# Patient Record
Sex: Female | Born: 1960 | Race: White | Hispanic: No | Marital: Married | Smoking: Never smoker
Health system: Southern US, Community
[De-identification: ages and names within clinical notes are randomized; demographics above are authoritative.]

## PROBLEM LIST (undated history)

## (undated) DIAGNOSIS — D249 Benign neoplasm of unspecified breast: Secondary | ICD-10-CM

## (undated) DIAGNOSIS — S83519A Sprain of anterior cruciate ligament of unspecified knee, initial encounter: Secondary | ICD-10-CM

## (undated) DIAGNOSIS — D259 Leiomyoma of uterus, unspecified: Secondary | ICD-10-CM

## (undated) DIAGNOSIS — N951 Menopausal and female climacteric states: Secondary | ICD-10-CM

## (undated) DIAGNOSIS — M419 Scoliosis, unspecified: Secondary | ICD-10-CM

## (undated) DIAGNOSIS — N898 Other specified noninflammatory disorders of vagina: Secondary | ICD-10-CM

## (undated) DIAGNOSIS — N6009 Solitary cyst of unspecified breast: Secondary | ICD-10-CM

## (undated) DIAGNOSIS — N941 Unspecified dyspareunia: Secondary | ICD-10-CM

## (undated) HISTORY — PX: ACHILLES TENDON SURGERY: SHX542

## (undated) HISTORY — PX: ENDOVENOUS ABLATION SAPHENOUS VEIN W/ LASER: SUR449

## (undated) HISTORY — DX: Scoliosis, unspecified: M41.9

## (undated) HISTORY — PX: OTHER SURGICAL HISTORY: SHX169

## (undated) HISTORY — DX: Solitary cyst of unspecified breast: N60.09

## (undated) HISTORY — DX: Other specified noninflammatory disorders of vagina: N89.8

## (undated) HISTORY — DX: Menopausal and female climacteric states: N95.1

## (undated) HISTORY — DX: Unspecified dyspareunia: N94.10

## (undated) HISTORY — DX: Leiomyoma of uterus, unspecified: D25.9

## (undated) HISTORY — DX: Benign neoplasm of unspecified breast: D24.9

## (undated) HISTORY — DX: Sprain of anterior cruciate ligament of unspecified knee, initial encounter: S83.519A

---

## 2009-03-02 ENCOUNTER — Ambulatory Visit: Payer: Self-pay | Admitting: Orthopedic Surgery

## 2009-08-13 ENCOUNTER — Encounter: Admission: RE | Admit: 2009-08-13 | Discharge: 2009-08-13 | Payer: Self-pay | Admitting: Obstetrics and Gynecology

## 2010-08-01 ENCOUNTER — Other Ambulatory Visit: Payer: Self-pay | Admitting: Obstetrics and Gynecology

## 2010-08-01 DIAGNOSIS — Z1231 Encounter for screening mammogram for malignant neoplasm of breast: Secondary | ICD-10-CM

## 2010-08-15 ENCOUNTER — Ambulatory Visit
Admission: RE | Admit: 2010-08-15 | Discharge: 2010-08-15 | Disposition: A | Discharge status: Home / Self Care | Payer: Private Health Insurance - Indemnity | Source: Ambulatory Visit | Attending: Obstetrics and Gynecology | Admitting: Obstetrics and Gynecology

## 2010-08-15 DIAGNOSIS — Z1231 Encounter for screening mammogram for malignant neoplasm of breast: Secondary | ICD-10-CM

## 2011-08-06 ENCOUNTER — Other Ambulatory Visit: Payer: Self-pay | Admitting: Obstetrics and Gynecology

## 2011-08-06 DIAGNOSIS — Z1231 Encounter for screening mammogram for malignant neoplasm of breast: Secondary | ICD-10-CM

## 2011-08-19 ENCOUNTER — Ambulatory Visit
Admission: RE | Admit: 2011-08-19 | Discharge: 2011-08-19 | Disposition: A | Discharge status: Home / Self Care | Payer: Private Health Insurance - Indemnity | Source: Ambulatory Visit | Attending: Obstetrics and Gynecology | Admitting: Obstetrics and Gynecology

## 2011-08-19 DIAGNOSIS — Z1231 Encounter for screening mammogram for malignant neoplasm of breast: Secondary | ICD-10-CM

## 2012-08-11 ENCOUNTER — Other Ambulatory Visit: Payer: Self-pay

## 2012-08-11 DIAGNOSIS — Z1231 Encounter for screening mammogram for malignant neoplasm of breast: Secondary | ICD-10-CM

## 2012-09-15 ENCOUNTER — Ambulatory Visit
Admission: RE | Admit: 2012-09-15 | Discharge: 2012-09-15 | Disposition: A | Discharge status: Home / Self Care | Payer: 59 | Source: Ambulatory Visit

## 2012-09-15 DIAGNOSIS — Z1231 Encounter for screening mammogram for malignant neoplasm of breast: Secondary | ICD-10-CM

## 2013-08-20 LAB — HM PAP SMEAR: HM Pap smear: NEGATIVE

## 2013-08-25 ENCOUNTER — Other Ambulatory Visit: Payer: Self-pay | Admitting: Obstetrics and Gynecology

## 2013-08-25 DIAGNOSIS — Z1231 Encounter for screening mammogram for malignant neoplasm of breast: Secondary | ICD-10-CM

## 2013-09-16 ENCOUNTER — Ambulatory Visit
Admission: RE | Admit: 2013-09-16 | Discharge: 2013-09-16 | Disposition: A | Discharge status: Home / Self Care | Payer: 59 | Source: Ambulatory Visit | Attending: Obstetrics and Gynecology | Admitting: Obstetrics and Gynecology

## 2013-09-16 DIAGNOSIS — Z1231 Encounter for screening mammogram for malignant neoplasm of breast: Secondary | ICD-10-CM

## 2013-09-16 LAB — HM MAMMOGRAPHY

## 2014-08-30 ENCOUNTER — Other Ambulatory Visit: Payer: Self-pay | Admitting: Obstetrics and Gynecology

## 2014-08-30 ENCOUNTER — Other Ambulatory Visit: Payer: Self-pay

## 2014-08-30 DIAGNOSIS — Z1231 Encounter for screening mammogram for malignant neoplasm of breast: Secondary | ICD-10-CM

## 2014-08-30 DIAGNOSIS — Z01419 Encounter for gynecological examination (general) (routine) without abnormal findings: Secondary | ICD-10-CM

## 2014-09-18 ENCOUNTER — Ambulatory Visit: Payer: Self-pay

## 2014-09-20 ENCOUNTER — Ambulatory Visit
Admission: RE | Admit: 2014-09-20 | Discharge: 2014-09-20 | Disposition: A | Discharge status: Home / Self Care | Payer: 59 | Source: Ambulatory Visit

## 2014-09-20 DIAGNOSIS — Z1231 Encounter for screening mammogram for malignant neoplasm of breast: Secondary | ICD-10-CM

## 2015-09-06 ENCOUNTER — Encounter: Payer: Self-pay | Admitting: Obstetrics and Gynecology

## 2015-09-25 ENCOUNTER — Ambulatory Visit (INDEPENDENT_AMBULATORY_CARE_PROVIDER_SITE_OTHER): Payer: 59 | Admitting: Obstetrics and Gynecology

## 2015-09-25 ENCOUNTER — Encounter: Payer: Self-pay | Admitting: Obstetrics and Gynecology

## 2015-09-25 VITALS — BP 106/71 | HR 65 | Ht 66.0 in | Wt 146.0 lb

## 2015-09-25 DIAGNOSIS — Z01419 Encounter for gynecological examination (general) (routine) without abnormal findings: Secondary | ICD-10-CM

## 2015-09-25 DIAGNOSIS — N952 Postmenopausal atrophic vaginitis: Secondary | ICD-10-CM

## 2015-09-25 DIAGNOSIS — Z780 Asymptomatic menopausal state: Secondary | ICD-10-CM | POA: Insufficient documentation

## 2015-09-25 NOTE — Progress Notes (Signed)
Patient ID: Madison Campbell, female   DOB: 12-03-1960, 55 y.o.   MRN: [id number] ANNUAL PREVENTATIVE CARE GYN  ENCOUNTER NOTE  Subjective:       Madison Campbell is a 55 y.o. G23P1001 female here for a routine annual gynecologic exam.  Current complaints: 1.  No complaints    Gynecologic History No LMP recorded. Patient is postmenopausal. Contraception: post menopausal status Last Pap: 09/2013. Results were: normal Last mammogram: 09/2014. Results were: normal Currently not sexually active. 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 Dyspareunia, female   . Menopausal state   . Breast fibroadenoma   . Breast cyst   . Scoliosis   . Vaginal dryness   . Torn ACL   . Uterine fibroid     Past Surgical History  Procedure Laterality Date  . Excision fibroadenoma    . Achilles tendon surgery    . Acl repair    . Endovenous ablation saphenous vein w/ laser      Current Outpatient Prescriptions on File Prior to Visit  Medication Sig Dispense Refill  . calcium carbonate (CALCIUM 600) 600 MG TABS tablet Take by mouth.     No current facility-administered medications on file prior to visit.    Allergies  Allergen Reactions  . Penicillin G Other (See Comments)    Sweating and decreased pulse     Social History   Social History  . Marital Status: Married    Spouse Name: N/A  . Number of Children: N/A  . Years of Education: N/A   Occupational History  . Not on file.   Social History Main Topics  . Smoking status: Never Smoker   . Smokeless tobacco: Not on file  . Alcohol Use: No  . Drug Use: No  . Sexual Activity: No   Other Topics Concern  . Not on file   Social History  Narrative    Family History  Problem Relation Age of Onset  . Breast cancer Paternal Aunt   . Colon cancer Neg Hx   . Ovarian cancer Neg Hx   . Heart disease Neg Hx     The following portions of the patient's history were reviewed and updated as appropriate: allergies, current medications, past family history, past medical history, past social history, past surgical history and problem list.  Review of Systems ROS Review of Systems - General ROS: negative for - chills, fatigue, fever, hot flashes, night sweats, weight gain or weight loss Psychological ROS: negative for - anxiety, decreased libido, depression, mood swings, physical abuse or sexual abuse Ophthalmic ROS: negative for - blurry vision, eye pain or loss of vision ENT ROS: negative for - headaches, hearing change, visual changes or vocal changes Allergy and Immunology ROS: negative for - hives, itchy/watery eyes or seasonal allergies Hematological and Lymphatic ROS: negative for - bleeding problems, bruising, swollen lymph nodes or weight loss Endocrine ROS: negative for - galactorrhea, hair pattern changes, hot flashes, malaise/lethargy, mood swings, palpitations, polydipsia/polyuria, skin changes, temperature intolerance or unexpected weight changes Breast ROS: negative for - new or  changing breast lumps or nipple discharge Respiratory ROS: negative for - cough or shortness of breath Cardiovascular ROS: negative for - chest pain, irregular heartbeat, palpitations or shortness of breath Gastrointestinal ROS: no abdominal pain, change in bowel habits, or black or bloody stools Genito-Urinary ROS: no dysuria, trouble voiding, or hematuria Musculoskeletal ROS: negative for - joint pain or joint stiffness Neurological ROS: negative for - bowel and bladder control changes Dermatological ROS: negative for rash and skin lesion changes   Objective:   BP 106/71 mmHg  Pulse 65  Ht 5\' 6"  (1.676 m)  Wt 146 lb (66.225 kg)  BMI  23.58 kg/m2 CONSTITUTIONAL: Well-developed, well-nourished female in no acute distress.  PSYCHIATRIC: Normal mood and affect. Normal behavior. Normal judgment and thought content. [place]: Alert and oriented to person, place, and time. Normal muscle tone coordination. No cranial nerve deficit noted. HENT:  Normocephalic, atraumatic, External right and left ear normal. Oropharynx is clear and moist EYES: Conjunctivae and EOM are normal.  No scleral icterus.  NECK: Normal range of motion, supple, no masses.  Normal thyroid.  SKIN: Skin is warm and dry. No rash noted. Not diaphoretic. No erythema. No pallor. CARDIOVASCULAR: Normal heart rate noted, regular rhythm, no murmur. RESPIRATORY: Clear to auscultation bilaterally. Effort and breath sounds normal, no problems with respiration noted. BREASTS: Symmetric in size. No masses, skin changes, nipple drainage, or lymphadenopathy. ABDOMEN: Soft, normal bowel sounds, no distention noted.  No tenderness, rebound or guarding.  BLADDER: Normal PELVIC:  External Genitalia: Moderate atrophy  BUS: Normal  Vagina: Moderate to severe atrophy with decreased ruga, pale mucosa and minimal petechiae  Cervix: Normal; No lesions  Uterus: Normal; Midplane, normal size and shape, mobile  Adnexa: Normal  RV: External Exam NormaI, No Rectal Masses and Normal Sphincter tone  MUSCULOSKELETAL: Normal range of motion. No tenderness.  No cyanosis, clubbing, or edema.  2+ distal pulses. LYMPHATIC: No Axillary, Supraclavicular, or Inguinal Adenopathy.    Assessment:   Annual gynecologic examination 55 y.o. Contraception: post menopausal status Normal BMI Vaginal atrophy, moderate to severe, asymptomatic, not desiring treatment  Plan:  Pap: due 2018 Mammogram: done 09/2014 birad 1 Stool Guaiac Testing:  Ordered Labs: vit d tsh fbs a1c lipid Routine preventative health maintenance measures emphasized: Exercise/Diet/Weight control, Tobacco Warnings and  Alcohol/Substance use risks Return to [place], CMA  Brayton Mars, MD  Note: This dictation was prepared with Dragon dictation along with smaller phrase technology. Any transcriptional errors that result from this process are unintentional.

## 2015-09-25 NOTE — Patient Instructions (Signed)
1.  No Pap smear until 2018 2.  Mammogram ordered. 3.  Continue with healthy eating and exercise. 4.  Continue with calcium and vitamin D supplementation. 5.  Return in 1 year

## 2015-09-26 LAB — LIPID PANEL
CHOL/HDL RATIO: 2.6 ratio (ref 0.0–4.4)
Cholesterol, Total: 204 mg/dL — ABNORMAL HIGH (ref 100–199)
HDL: 77 mg/dL (ref >39)
LDL CALC: 118 mg/dL — AB (ref 0–99)
Triglycerides: 45 mg/dL (ref 0–149)
VLDL Cholesterol Cal: 9 mg/dL (ref 5–40)

## 2015-09-26 LAB — HEMOGLOBIN A1C
Est. average glucose Bld gHb Est-mCnc: 103 mg/dL
Hgb A1c MFr Bld: 5.2 % (ref 4.8–5.6)

## 2015-09-26 LAB — GLUCOSE, RANDOM: GLUCOSE: 89 mg/dL (ref 65–99)

## 2015-09-26 LAB — TSH: TSH: 1.19 u[IU]/mL (ref 0.450–4.500)

## 2015-09-26 LAB — VITAMIN D 25 HYDROXY (VIT D DEFICIENCY, FRACTURES): Vit D, 25-Hydroxy: 30.6 ng/mL (ref 30.0–100.0)

## 2015-09-30 LAB — FECAL OCCULT BLOOD, IMMUNOCHEMICAL: Fecal Occult Bld: NEGATIVE

## 2016-09-29 NOTE — Progress Notes (Signed)
Patient ID: Madison Campbell, female   DOB: [birth year], 58 y.o.   MRN: [id number] ANNUAL PREVENTATIVE CARE GYN  ENCOUNTER NOTE  Subjective:       Madison Campbell is a 56 y.o. G70P1001 female here for a routine annual gynecologic exam.  Current complaints: 1.  No complaints   Patient reports no new medical issues since her last exam.  She is post menopausal, and denies any vasomotor symptoms, vaginal dryness, urinary changes, changes in bowel habits.  She states she has had a 5# weight gain since last year and attributes it to more family visiting and eating more with family and friends.  She remains active and plays tennis regularly and maintains a healthy lifestyle avoiding alcohol, tobacco and drugs.  She takes Calcium supplementation 600mg / daily.    Gynecologic History No LMP recorded. Patient is postmenopausal. Contraception: post menopausal status Last Pap: 09/2013.  Neg/neg Results were: normal Last mammogram: 09/2014. birad 1 Results were: normal Currently not sexually active. 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 Breast cyst   . Breast fibroadenoma   . Dyspareunia, female   . Menopausal state   . Scoliosis   . Torn ACL   . Uterine fibroid   . Vaginal dryness     Past Surgical History:  Procedure Laterality Date  . ACHILLES TENDON SURGERY    . ACL REPAIR    . ENDOVENOUS ABLATION SAPHENOUS VEIN W/ LASER    . EXCISION FIBROADENOMA      Current Outpatient Prescriptions on File Prior to Visit  Medication Sig Dispense Refill  . calcium carbonate (CALCIUM 600) 600 MG TABS tablet Take by mouth.     No current facility-administered medications on  file prior to visit.     Allergies  Allergen Reactions  . Penicillin G Other (See Comments)    Sweating and decreased pulse     Social History   Social History  . Marital status: Married    Spouse name: N/A  . Number of children: N/A  . Years of education: N/A   Occupational History  . Not on file.   Social History Main Topics  . Smoking status: Never Smoker  . Smokeless tobacco: Never Used  . Alcohol use No  . Drug use: No  . Sexual activity: No   Other Topics Concern  . Not on file   Social History Narrative  . No narrative on file    Family History  Problem Relation Age of Onset  . Breast cancer Paternal Aunt   . Colon cancer Neg Hx   . Ovarian cancer Neg Hx   . Heart disease Neg Hx     The following portions of the patient's history were reviewed and updated as appropriate: allergies, current medications, past family history, past medical history, past social history, past surgical history and problem list.  Review of Systems ROS  See HPI for pertinent HPI Objective:   BP 124/77   Pulse (!) 50   Ht 5\' 6"  (  1.676 m)   Wt 150 lb 12.8 oz (68.4 kg)   BMI 24.34 kg/m  CONSTITUTIONAL: Well-developed, well-nourished female in no acute distress.  PSYCHIATRIC: Normal mood and affect. Normal behavior. Normal judgment and thought content. [place]: Alert and oriented to person, place, and time. Normal muscle tone coordination. No cranial nerve deficit noted. HENT:  Normocephalic, atraumatic, External right and left ear normal. Oropharynx is clear and moist EYES: Conjunctivae and EOM are normal.  No scleral icterus.  NECK: Normal range of motion, supple, no masses.  Normal thyroid.  SKIN: Skin is warm and dry. No rash noted. Not diaphoretic. No erythema. No pallor. CARDIOVASCULAR: Normal heart rate noted, regular rhythm, no murmur. RESPIRATORY: Clear to auscultation bilaterally. Effort and breath sounds normal, no problems with respiration noted. BREASTS:  Symmetric in size. No masses, skin changes, nipple drainage, or lymphadenopathy. ABDOMEN: Soft, normal bowel sounds, no distention noted.  No tenderness, rebound or guarding.  BLADDER: Normal PELVIC:  External Genitalia: Moderate atrophy  BUS: Normal  Vagina: Moderate to severe atrophy with decreased ruga, pale mucosa and minimal petechiae  Cervix: Normal; No lesions  Uterus: Normal; Midplane, normal size and shape, mobile  Adnexa: Normal, non tender, nonpalpable  RV: External Exam NormaI, No Rectal Masses and Normal Sphincter tone  MUSCULOSKELETAL: Normal range of motion. No tenderness.  No cyanosis, clubbing, or edema.  2+ distal pulses. LYMPHATIC: No Axillary, Supraclavicular, or Inguinal Adenopathy.    Assessment:   Annual gynecologic examination 56 y.o. Contraception: post menopausal status Normal BMI Vaginal atrophy, moderate to severe, asymptomatic, not desiring treatment  Plan:  Pap: pap/hpv Mammogram: ordered Stool Guaiac Testing:  Ordered Labs: fbs a1c lipid tsh Routine preventative health maintenance measures emphasized: Exercise/Diet/Weight control, Tobacco Warnings and Alcohol/Substance use risks Return to Clinic - [place], Student-PA  Brayton Mars, MD   I have seen, interviewed, and examined the patient in conjunction with the [place].A. student and affirm the diagnosis and management plan. Riddick Nuon A. Chaney Ingram, MD, FACOG   Note: This dictation was prepared with Dragon dictation along with smaller phrase technology. Any transcriptional errors that result from this process are unintentional.

## 2016-09-30 ENCOUNTER — Ambulatory Visit (INDEPENDENT_AMBULATORY_CARE_PROVIDER_SITE_OTHER): Payer: 59 | Admitting: Obstetrics and Gynecology

## 2016-09-30 ENCOUNTER — Encounter: Payer: Self-pay | Admitting: Obstetrics and Gynecology

## 2016-09-30 VITALS — BP 124/77 | HR 50 | Ht 66.0 in | Wt 150.8 lb

## 2016-09-30 DIAGNOSIS — Z780 Asymptomatic menopausal state: Secondary | ICD-10-CM | POA: Diagnosis not present

## 2016-09-30 DIAGNOSIS — Z01419 Encounter for gynecological examination (general) (routine) without abnormal findings: Secondary | ICD-10-CM

## 2016-09-30 DIAGNOSIS — Z1231 Encounter for screening mammogram for malignant neoplasm of breast: Secondary | ICD-10-CM

## 2016-09-30 DIAGNOSIS — Z1239 Encounter for other screening for malignant neoplasm of breast: Secondary | ICD-10-CM

## 2016-09-30 DIAGNOSIS — Z1211 Encounter for screening for malignant neoplasm of colon: Secondary | ICD-10-CM

## 2016-09-30 DIAGNOSIS — N952 Postmenopausal atrophic vaginitis: Secondary | ICD-10-CM | POA: Diagnosis not present

## 2016-09-30 NOTE — Patient Instructions (Signed)
1. Pap smear is done 2. Mammogram is ordered 3. This stool guaiac card testing is ordered 4. Screening labs are ordered 5. Continue with healthy eating, exercise. 6. Continue with calcium and vitamin D supplementation 7. Return in 1 year   Health Maintenance for Postmenopausal Women Menopause is a normal process in which your reproductive ability comes to an end. This process happens gradually over a span of months to years, usually between the ages of 74 and 72. Menopause is complete when you have missed 12 consecutive menstrual periods. It is important to talk with your health care provider about some of the most common conditions that affect postmenopausal women, such as heart disease, cancer, and bone loss (osteoporosis). Adopting a healthy lifestyle and getting preventive care can help to promote your health and wellness. Those actions can also lower your chances of developing some of these common conditions. What should I know about menopause? During menopause, you may experience a number of symptoms, such as:  Moderate-to-severe hot flashes.  Night sweats.  Decrease in sex drive.  Mood swings.  Headaches.  Tiredness.  Irritability.  Memory problems.  Insomnia.  Choosing to treat or not to treat menopausal changes is an individual decision that you make with your health care provider. What should I know about hormone replacement therapy and supplements? Hormone therapy products are effective for treating symptoms that are associated with menopause, such as hot flashes and night sweats. Hormone replacement carries certain risks, especially as you become older. If you are thinking about using estrogen or estrogen with progestin treatments, discuss the benefits and risks with your health care provider. What should I know about heart disease and stroke? Heart disease, heart attack, and stroke become more likely as you age. This may be due, in part, to the hormonal changes that  your body experiences during menopause. These can affect how your body processes dietary fats, triglycerides, and cholesterol. Heart attack and stroke are both medical emergencies. There are many things that you can do to help prevent heart disease and stroke:  Have your blood pressure checked at least every 1-2 years. High blood pressure causes heart disease and increases the risk of stroke.  If you are 59-69 years old, ask your health care provider if you should take aspirin to prevent a heart attack or a stroke.  Do not use any tobacco products, including cigarettes, chewing tobacco, or electronic cigarettes. If you need help quitting, ask your health care provider.  It is important to eat a healthy diet and maintain a healthy weight. ? Be sure to include plenty of vegetables, fruits, low-fat dairy products, and lean protein. ? Avoid eating foods that are high in solid fats, added sugars, or salt (sodium).  Get regular exercise. This is one of the most important things that you can do for your health. ? Try to exercise for at least 150 minutes each week. The type of exercise that you do should increase your heart rate and make you sweat. This is known as moderate-intensity exercise. ? Try to do strengthening exercises at least twice each week. Do these in addition to the moderate-intensity exercise.  Know your numbers.Ask your health care provider to check your cholesterol and your blood glucose. Continue to have your blood tested as directed by your health care provider.  What should I know about cancer screening? There are several types of cancer. Take the following steps to reduce your risk and to catch any cancer development as early as possible.  Breast Cancer  Practice breast self-awareness. ? This means understanding how your breasts normally appear and feel. ? It also means doing regular breast self-exams. Let your health care provider know about any changes, no matter how  small.  If you are 40 or older, have a clinician do a breast exam (clinical breast exam or CBE) every year. Depending on your age, family history, and medical history, it may be recommended that you also have a yearly breast X-ray (mammogram).  If you have a family history of breast cancer, talk with your health care provider about genetic screening.  If you are at high risk for breast cancer, talk with your health care provider about having an MRI and a mammogram every year.  Breast cancer (BRCA) gene test is recommended for women who have family members with BRCA-related cancers. Results of the assessment will determine the need for genetic counseling and BRCA1 and for BRCA2 testing. BRCA-related cancers include these types: ? Breast. This occurs in males or females. ? Ovarian. ? Tubal. This may also be called fallopian tube cancer. ? Cancer of the abdominal or pelvic lining (peritoneal cancer). ? Prostate. ? Pancreatic.  Cervical, Uterine, and Ovarian Cancer Your health care provider may recommend that you be screened regularly for cancer of the pelvic organs. These include your ovaries, uterus, and vagina. This screening involves a pelvic exam, which includes checking for microscopic changes to the surface of your cervix (Pap test).  For women ages 21-65, health care providers may recommend a pelvic exam and a Pap test every three years. For women ages 12-65, they may recommend the Pap test and pelvic exam, combined with testing for human papilloma virus (HPV), every five years. Some types of HPV increase your risk of cervical cancer. Testing for HPV may also be done on women of any age who have unclear Pap test results.  Other health care providers may not recommend any screening for nonpregnant women who are considered low risk for pelvic cancer and have no symptoms. Ask your health care provider if a screening pelvic exam is right for you.  If you have had past treatment for cervical  cancer or a condition that could lead to cancer, you need Pap tests and screening for cancer for at least 20 years after your treatment. If Pap tests have been discontinued for you, your risk factors (such as having a new sexual partner) need to be reassessed to determine if you should start having screenings again. Some women have medical problems that increase the chance of getting cervical cancer. In these cases, your health care provider may recommend that you have screening and Pap tests more often.  If you have a family history of uterine cancer or ovarian cancer, talk with your health care provider about genetic screening.  If you have vaginal bleeding after reaching menopause, tell your health care provider.  There are currently no reliable tests available to screen for ovarian cancer.  Lung Cancer Lung cancer screening is recommended for adults 22-56 years old who are at high risk for lung cancer because of a history of smoking. A yearly low-dose CT scan of the lungs is recommended if you:  Currently smoke.  Have a history of at least 30 pack-years of smoking and you currently smoke or have quit within the past 15 years. A pack-year is smoking an average of one pack of cigarettes per day for one year.  Yearly screening should:  Continue until it has been 15 years since  you quit.  Stop if you develop a health problem that would prevent you from having lung cancer treatment.  Colorectal Cancer  This type of cancer can be detected and can often be prevented.  Routine colorectal cancer screening usually begins at age 79 and continues through age 37.  If you have risk factors for colon cancer, your health care provider may recommend that you be screened at an earlier age.  If you have a family history of colorectal cancer, talk with your health care provider about genetic screening.  Your health care provider may also recommend using home test kits to check for hidden blood in your  stool.  A small camera at the end of a tube can be used to examine your colon directly (sigmoidoscopy or colonoscopy). This is done to check for the earliest forms of colorectal cancer.  Direct examination of the colon should be repeated every 5-10 years until age 24. However, if early forms of precancerous polyps or small growths are found or if you have a family history or genetic risk for colorectal cancer, you may need to be screened more often.  Skin Cancer  Check your skin from head to toe regularly.  Monitor any moles. Be sure to tell your health care provider: ? About any new moles or changes in moles, especially if there is a change in a mole's shape or color. ? If you have a mole that is larger than the size of a pencil eraser.  If any of your family members has a history of skin cancer, especially at a young age, talk with your health care provider about genetic screening.  Always use sunscreen. Apply sunscreen liberally and repeatedly throughout the day.  Whenever you are outside, protect yourself by wearing long sleeves, pants, a wide-brimmed hat, and sunglasses.  What should I know about osteoporosis? Osteoporosis is a condition in which bone destruction happens more quickly than new bone creation. After menopause, you may be at an increased risk for osteoporosis. To help prevent osteoporosis or the bone fractures that can happen because of osteoporosis, the following is recommended:  If you are 89-106 years old, get at least 1,000 mg of calcium and at least 600 mg of vitamin D per day.  If you are older than age 3 but younger than age 40, get at least 1,200 mg of calcium and at least 600 mg of vitamin D per day.  If you are older than age 38, get at least 1,200 mg of calcium and at least 800 mg of vitamin D per day.  Smoking and excessive alcohol intake increase the risk of osteoporosis. Eat foods that are rich in calcium and vitamin D, and do weight-bearing exercises  several times each week as directed by your health care provider. What should I know about how menopause affects my mental health? Depression may occur at any age, but it is more common as you become older. Common symptoms of depression include:  Low or sad mood.  Changes in sleep patterns.  Changes in appetite or eating patterns.  Feeling an overall lack of motivation or enjoyment of activities that you previously enjoyed.  Frequent crying spells.  Talk with your health care provider if you think that you are experiencing depression. What should I know about immunizations? It is important that you get and maintain your immunizations. These include:  Tetanus, diphtheria, and pertussis (Tdap) booster vaccine.  Influenza every year before the flu season begins.  Pneumonia vaccine.  Shingles  vaccine.  Your health care provider may also recommend other immunizations. This information is not intended to replace advice given to you by your health care provider. Make sure you discuss any questions you have with your health care provider. Document Released: 05/16/2005 Document Revised: 10/12/2015 Document Reviewed: 12/26/2014 Elsevier Interactive Patient Education  2018 [employer].

## 2016-10-01 LAB — LIPID PANEL
CHOLESTEROL TOTAL: 193 mg/dL (ref 100–199)
Chol/HDL Ratio: 3.2 ratio (ref 0.0–4.4)
HDL: 61 mg/dL (ref >39)
LDL Calculated: 116 mg/dL — ABNORMAL HIGH (ref 0–99)
TRIGLYCERIDES: 79 mg/dL (ref 0–149)
VLDL CHOLESTEROL CAL: 16 mg/dL (ref 5–40)

## 2016-10-01 LAB — GLUCOSE, RANDOM: Glucose: 91 mg/dL (ref 65–99)

## 2016-10-01 LAB — HEMOGLOBIN A1C
ESTIMATED AVERAGE GLUCOSE: 103 mg/dL
Hgb A1c MFr Bld: 5.2 % (ref 4.8–5.6)

## 2016-10-01 LAB — TSH: TSH: 1.35 u[IU]/mL (ref 0.450–4.500)

## 2016-10-02 LAB — PAP IG AND HPV HIGH-RISK
HPV, high-risk: NEGATIVE
PAP SMEAR COMMENT: 0

## 2016-10-03 LAB — FECAL OCCULT BLOOD, IMMUNOCHEMICAL: Fecal Occult Bld: NEGATIVE

## 2016-10-17 ENCOUNTER — Other Ambulatory Visit: Payer: Self-pay | Admitting: Obstetrics and Gynecology

## 2016-10-17 ENCOUNTER — Ambulatory Visit
Admission: RE | Admit: 2016-10-17 | Discharge: 2016-10-17 | Disposition: A | Discharge status: Home / Self Care | Payer: 59 | Source: Ambulatory Visit | Attending: Obstetrics and Gynecology | Admitting: Obstetrics and Gynecology

## 2016-10-17 DIAGNOSIS — Z1239 Encounter for other screening for malignant neoplasm of breast: Secondary | ICD-10-CM

## 2017-09-22 NOTE — Progress Notes (Signed)
Patient ID: Madison Campbell, female   DOB: [birth year], 59 y.o.   MRN: [id number] ANNUAL PREVENTATIVE CARE GYN  ENCOUNTER NOTE  Subjective:       Madison Campbell is a 57 y.o. G24P1001 female here for a routine annual gynecologic exam.  Current complaints:  1.  No complaints   Patient reports no new medical issues since her last exam.  She is post menopausal, and denies any vasomotor symptoms, vaginal dryness, urinary changes, changes in bowel habits.    She remains active and plays tennis regularly and maintains a healthy lifestyle avoiding alcohol, tobacco and drugs.  She takes Calcium supplementation 600mg / daily.    Gynecologic History No LMP recorded. Patient is postmenopausal. Contraception: post menopausal status Last Pap: 09/30/2016.  Neg/neg Results were: normal Last mammogram: 10/2016. birad 1 Results were: normal Currently not sexually active. 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 Breast cyst   . Breast fibroadenoma   . Dyspareunia, female   . Menopausal state   . Scoliosis   . Torn ACL   . Uterine fibroid   . Vaginal dryness     Past Surgical History:  Procedure Laterality Date  . ACHILLES TENDON SURGERY    . ACL REPAIR    . ENDOVENOUS ABLATION SAPHENOUS VEIN W/ LASER    . EXCISION FIBROADENOMA      Current Outpatient Medications on File Prior to Visit  Medication Sig Dispense Refill  . calcium carbonate (CALCIUM 600) 600 MG TABS tablet Take by mouth.     No current facility-administered medications on file prior to visit.     Allergies  Allergen Reactions  . Penicillin G Other (See Comments)    Sweating and decreased pulse      Social History   Socioeconomic History  . Marital status: Married    Spouse name: Not on file  . Number of children: Not on file  . Years of education: Not on file  . Highest education level: Not on file  Occupational History  . Not on file  Social Needs  . Financial resource strain: Not on file  . Food insecurity:    Worry: Not on file    Inability: Not on file  . Transportation needs:    Medical: Not on file    Non-medical: Not on file  Tobacco Use  . Smoking status: Never Smoker  . Smokeless tobacco: Never Used  Substance and Sexual Activity  . Alcohol use: No  . Drug use: No  . Sexual activity: Never  Lifestyle  . Physical activity:    Days per week: Not on file    Minutes per session: Not on file  . Stress: Not on file  Relationships  . Social connections:    Talks on phone: Not on file    Gets together: Not on file    Attends religious service: Not on file    Active member of club or organization: Not on file    Attends meetings of clubs or organizations: Not  on file    Relationship status: Not on file  . Intimate partner violence:    Fear of current or ex partner: Not on file    Emotionally abused: Not on file    Physically abused: Not on file    Forced sexual activity: Not on file  Other Topics Concern  . Not on file  Social History Narrative  . Not on file    Family History  Problem Relation Age of Onset  . Breast cancer Paternal Aunt   . Colon cancer Neg Hx   . Ovarian cancer Neg Hx   . Heart disease Neg Hx     The following portions of the patient's history were reviewed and updated as appropriate: allergies, current medications, past family history, past medical history, past social history, past surgical history and problem list.  Review of Systems Review of Systems  Constitutional: Negative.   HENT: Negative.   Eyes: Negative.   Respiratory: Negative.   Cardiovascular: Negative.   Gastrointestinal: Negative.   Genitourinary:  Negative.   Musculoskeletal: Negative.   Skin: Negative.   Neurological: Negative.   Psychiatric/Behavioral: Negative.     Objective:   BP 103/67   Pulse (!) 56   Ht 5\' 6"  (1.676 m)   Wt 152 lb 11.2 oz (69.3 kg)   BMI 24.65 kg/m   CONSTITUTIONAL: Well-developed, well-nourished female in no acute distress.  PSYCHIATRIC: Normal mood and affect. Normal behavior. Normal judgment and thought content. [place]: Alert and oriented to person, place, and time. Normal muscle tone coordination. No cranial nerve deficit noted. HENT:  Normocephalic, atraumatic, External right and left ear normal.  EYES: Conjunctivae and EOM are normal.  No scleral icterus.  NECK: Normal range of motion, supple, no masses.  Normal thyroid.  SKIN: Skin is warm and dry. No rash noted. Not diaphoretic. No erythema. No pallor. CARDIOVASCULAR: Normal heart rate noted, regular rhythm, no murmur. RESPIRATORY: Clear to auscultation bilaterally. Effort and breath sounds normal, no problems with respiration noted. BREASTS: Symmetric in size. No masses, skin changes, nipple drainage, or lymphadenopathy. ABDOMEN: Soft, no distention noted.  No tenderness, rebound or guarding.  BLADDER: Normal PELVIC: (Unchanged) Peterson speculum used  External Genitalia: Moderate atrophy  BUS: Normal  Vagina: Moderate to severe atrophy with decreased ruga, pale mucosa and minimal petechiae; single digit exam performed  Cervix: Normal; No lesions  Uterus: Normal; Midplane, normal size and shape, mobile  Adnexa: Normal, non tender, nonpalpable  RV: External Exam NormaI, No Rectal Masses and Normal Sphincter tone  MUSCULOSKELETAL: Normal range of motion. No tenderness.  No cyanosis, clubbing, or edema.  2+ distal pulses. LYMPHATIC: No Axillary, Supraclavicular, or Inguinal Adenopathy.    Assessment:   Annual gynecologic examination 57 y.o. Contraception: post menopausal status Normal BMI Vaginal atrophy, moderate to severe,  asymptomatic, not desiring treatment  Plan:  Pap: Due 2021 Mammogram: ordered Stool Guaiac Testing:  Ordered Labs: fbs a1c lipid tsh Routine preventative health maintenance measures emphasized: Exercise/Diet/Weight control, Tobacco Warnings and Alcohol/Substance use risks Continue with calcium and vitamin D supplementation twice daily Return to [place], CMA  Brayton Mars, MD  Note: This dictation was prepared with Dragon dictation along with smaller phrase technology. Any transcriptional errors that result from this process are unintentional.

## 2017-10-01 ENCOUNTER — Other Ambulatory Visit: Payer: Self-pay | Admitting: Obstetrics and Gynecology

## 2017-10-01 ENCOUNTER — Encounter: Payer: Self-pay | Admitting: Obstetrics and Gynecology

## 2017-10-01 ENCOUNTER — Ambulatory Visit (INDEPENDENT_AMBULATORY_CARE_PROVIDER_SITE_OTHER): Payer: 59 | Admitting: Obstetrics and Gynecology

## 2017-10-01 VITALS — BP 103/67 | HR 56 | Ht 66.0 in | Wt 152.7 lb

## 2017-10-01 DIAGNOSIS — Z1231 Encounter for screening mammogram for malignant neoplasm of breast: Secondary | ICD-10-CM

## 2017-10-01 DIAGNOSIS — Z1211 Encounter for screening for malignant neoplasm of colon: Secondary | ICD-10-CM | POA: Diagnosis not present

## 2017-10-01 DIAGNOSIS — Z1239 Encounter for other screening for malignant neoplasm of breast: Secondary | ICD-10-CM

## 2017-10-01 DIAGNOSIS — Z780 Asymptomatic menopausal state: Secondary | ICD-10-CM | POA: Diagnosis not present

## 2017-10-01 DIAGNOSIS — N952 Postmenopausal atrophic vaginitis: Secondary | ICD-10-CM

## 2017-10-01 DIAGNOSIS — Z01419 Encounter for gynecological examination (general) (routine) without abnormal findings: Secondary | ICD-10-CM

## 2017-10-01 NOTE — Patient Instructions (Signed)
1.  No Pap smear is done.  Next Pap smear is due 2021. 2.  Mammogram is ordered. 3.  Stool guaiac card testing for colon cancer screening is ordered. 4.  Screening labs are obtained today. 5.  Continue with healthy eating and exercise. 6.  Continue with calcium and vitamin D supplementation twice a day. 7.  Return in 1 year for annual exam   Health Maintenance for Postmenopausal Women Menopause is a normal process in which your reproductive ability comes to an end. This process happens gradually over a span of months to years, usually between the ages of 58 and 61. Menopause is complete when you have missed 12 consecutive menstrual periods. It is important to talk with your health care provider about some of the most common conditions that affect postmenopausal women, such as heart disease, cancer, and bone loss (osteoporosis). Adopting a healthy lifestyle and getting preventive care can help to promote your health and wellness. Those actions can also lower your chances of developing some of these common conditions. What should I know about menopause? During menopause, you may experience a number of symptoms, such as:  Moderate-to-severe hot flashes.  Night sweats.  Decrease in sex drive.  Mood swings.  Headaches.  Tiredness.  Irritability.  Memory problems.  Insomnia.  Choosing to treat or not to treat menopausal changes is an individual decision that you make with your health care provider. What should I know about hormone replacement therapy and supplements? Hormone therapy products are effective for treating symptoms that are associated with menopause, such as hot flashes and night sweats. Hormone replacement carries certain risks, especially as you become older. If you are thinking about using estrogen or estrogen with progestin treatments, discuss the benefits and risks with your health care provider. What should I know about heart disease and stroke? Heart disease, heart  attack, and stroke become more likely as you age. This may be due, in part, to the hormonal changes that your body experiences during menopause. These can affect how your body processes dietary fats, triglycerides, and cholesterol. Heart attack and stroke are both medical emergencies. There are many things that you can do to help prevent heart disease and stroke:  Have your blood pressure checked at least every 1-2 years. High blood pressure causes heart disease and increases the risk of stroke.  If you are 45-27 years old, ask your health care provider if you should take aspirin to prevent a heart attack or a stroke.  Do not use any tobacco products, including cigarettes, chewing tobacco, or electronic cigarettes. If you need help quitting, ask your health care provider.  It is important to eat a healthy diet and maintain a healthy weight. ? Be sure to include plenty of vegetables, fruits, low-fat dairy products, and lean protein. ? Avoid eating foods that are high in solid fats, added sugars, or salt (sodium).  Get regular exercise. This is one of the most important things that you can do for your health. ? Try to exercise for at least 150 minutes each week. The type of exercise that you do should increase your heart rate and make you sweat. This is known as moderate-intensity exercise. ? Try to do strengthening exercises at least twice each week. Do these in addition to the moderate-intensity exercise.  Know your numbers.Ask your health care provider to check your cholesterol and your blood glucose. Continue to have your blood tested as directed by your health care provider.  What should I know about  cancer screening? There are several types of cancer. Take the following steps to reduce your risk and to catch any cancer development as early as possible. Breast Cancer  Practice breast self-awareness. ? This means understanding how your breasts normally appear and feel. ? It also means  doing regular breast self-exams. Let your health care provider know about any changes, no matter how small.  If you are 33 or older, have a clinician do a breast exam (clinical breast exam or CBE) every year. Depending on your age, family history, and medical history, it may be recommended that you also have a yearly breast X-ray (mammogram).  If you have a family history of breast cancer, talk with your health care provider about genetic screening.  If you are at high risk for breast cancer, talk with your health care provider about having an MRI and a mammogram every year.  Breast cancer (BRCA) gene test is recommended for women who have family members with BRCA-related cancers. Results of the assessment will determine the need for genetic counseling and BRCA1 and for BRCA2 testing. BRCA-related cancers include these types: ? Breast. This occurs in males or females. ? Ovarian. ? Tubal. This may also be called fallopian tube cancer. ? Cancer of the abdominal or pelvic lining (peritoneal cancer). ? Prostate. ? Pancreatic.  Cervical, Uterine, and Ovarian Cancer Your health care provider may recommend that you be screened regularly for cancer of the pelvic organs. These include your ovaries, uterus, and vagina. This screening involves a pelvic exam, which includes checking for microscopic changes to the surface of your cervix (Pap test).  For women ages 21-65, health care providers may recommend a pelvic exam and a Pap test every three years. For women ages 17-65, they may recommend the Pap test and pelvic exam, combined with testing for human papilloma virus (HPV), every five years. Some types of HPV increase your risk of cervical cancer. Testing for HPV may also be done on women of any age who have unclear Pap test results.  Other health care providers may not recommend any screening for nonpregnant women who are considered low risk for pelvic cancer and have no symptoms. Ask your health care  provider if a screening pelvic exam is right for you.  If you have had past treatment for cervical cancer or a condition that could lead to cancer, you need Pap tests and screening for cancer for at least 20 years after your treatment. If Pap tests have been discontinued for you, your risk factors (such as having a new sexual partner) need to be reassessed to determine if you should start having screenings again. Some women have medical problems that increase the chance of getting cervical cancer. In these cases, your health care provider may recommend that you have screening and Pap tests more often.  If you have a family history of uterine cancer or ovarian cancer, talk with your health care provider about genetic screening.  If you have vaginal bleeding after reaching menopause, tell your health care provider.  There are currently no reliable tests available to screen for ovarian cancer.  Lung Cancer Lung cancer screening is recommended for adults 56-42 years old who are at high risk for lung cancer because of a history of smoking. A yearly low-dose CT scan of the lungs is recommended if you:  Currently smoke.  Have a history of at least 30 pack-years of smoking and you currently smoke or have quit within the past 15 years. A pack-year is  smoking an average of one pack of cigarettes per day for one year.  Yearly screening should:  Continue until it has been 15 years since you quit.  Stop if you develop a health problem that would prevent you from having lung cancer treatment.  Colorectal Cancer  This type of cancer can be detected and can often be prevented.  Routine colorectal cancer screening usually begins at age 61 and continues through age 63.  If you have risk factors for colon cancer, your health care provider may recommend that you be screened at an earlier age.  If you have a family history of colorectal cancer, talk with your health care provider about genetic  screening.  Your health care provider may also recommend using home test kits to check for hidden blood in your stool.  A small camera at the end of a tube can be used to examine your colon directly (sigmoidoscopy or colonoscopy). This is done to check for the earliest forms of colorectal cancer.  Direct examination of the colon should be repeated every 5-10 years until age 91. However, if early forms of precancerous polyps or small growths are found or if you have a family history or genetic risk for colorectal cancer, you may need to be screened more often.  Skin Cancer  Check your skin from head to toe regularly.  Monitor any moles. Be sure to tell your health care provider: ? About any new moles or changes in moles, especially if there is a change in a mole's shape or color. ? If you have a mole that is larger than the size of a pencil eraser.  If any of your family members has a history of skin cancer, especially at a young age, talk with your health care provider about genetic screening.  Always use sunscreen. Apply sunscreen liberally and repeatedly throughout the day.  Whenever you are outside, protect yourself by wearing long sleeves, pants, a wide-brimmed hat, and sunglasses.  What should I know about osteoporosis? Osteoporosis is a condition in which bone destruction happens more quickly than new bone creation. After menopause, you may be at an increased risk for osteoporosis. To help prevent osteoporosis or the bone fractures that can happen because of osteoporosis, the following is recommended:  If you are 64-70 years old, get at least 1,000 mg of calcium and at least 600 mg of vitamin D per day.  If you are older than age 29 but younger than age 36, get at least 1,200 mg of calcium and at least 600 mg of vitamin D per day.  If you are older than age 72, get at least 1,200 mg of calcium and at least 800 mg of vitamin D per day.  Smoking and excessive alcohol intake  increase the risk of osteoporosis. Eat foods that are rich in calcium and vitamin D, and do weight-bearing exercises several times each week as directed by your health care provider. What should I know about how menopause affects my mental health? Depression may occur at any age, but it is more common as you become older. Common symptoms of depression include:  Low or sad mood.  Changes in sleep patterns.  Changes in appetite or eating patterns.  Feeling an overall lack of motivation or enjoyment of activities that you previously enjoyed.  Frequent crying spells.  Talk with your health care provider if you think that you are experiencing depression. What should I know about immunizations? It is important that you get and maintain  your immunizations. These include:  Tetanus, diphtheria, and pertussis (Tdap) booster vaccine.  Influenza every year before the flu season begins.  Pneumonia vaccine.  Shingles vaccine.  Your health care provider may also recommend other immunizations. This information is not intended to replace advice given to you by your health care provider. Make sure you discuss any questions you have with your health care provider. Document Released: 05/16/2005 Document Revised: 10/12/2015 Document Reviewed: 12/26/2014 Elsevier Interactive Patient Education  2018 [employer].

## 2017-10-02 LAB — LIPID PANEL
CHOL/HDL RATIO: 3.3 ratio (ref 0.0–4.4)
Cholesterol, Total: 216 mg/dL — ABNORMAL HIGH (ref 100–199)
HDL: 65 mg/dL (ref >39)
LDL CALC: 137 mg/dL — AB (ref 0–99)
TRIGLYCERIDES: 68 mg/dL (ref 0–149)
VLDL CHOLESTEROL CAL: 14 mg/dL (ref 5–40)

## 2017-10-02 LAB — HEMOGLOBIN A1C
Est. average glucose Bld gHb Est-mCnc: 103 mg/dL
Hgb A1c MFr Bld: 5.2 % (ref 4.8–5.6)

## 2017-10-02 LAB — TSH: TSH: 1.51 u[IU]/mL (ref 0.450–4.500)

## 2017-10-02 LAB — GLUCOSE, RANDOM: GLUCOSE: 89 mg/dL (ref 65–99)

## 2017-10-08 LAB — FECAL OCCULT BLOOD, IMMUNOCHEMICAL: FECAL OCCULT BLD: NEGATIVE

## 2017-11-02 ENCOUNTER — Ambulatory Visit
Admission: RE | Admit: 2017-11-02 | Discharge: 2017-11-02 | Disposition: A | Discharge status: Home / Self Care | Payer: 59 | Source: Ambulatory Visit | Attending: Obstetrics and Gynecology | Admitting: Obstetrics and Gynecology

## 2017-11-02 DIAGNOSIS — Z1231 Encounter for screening mammogram for malignant neoplasm of breast: Secondary | ICD-10-CM

## 2018-09-28 ENCOUNTER — Other Ambulatory Visit: Payer: Self-pay | Admitting: Obstetrics and Gynecology

## 2018-09-28 DIAGNOSIS — Z1231 Encounter for screening mammogram for malignant neoplasm of breast: Secondary | ICD-10-CM

## 2018-10-07 ENCOUNTER — Encounter: Payer: 59 | Admitting: Obstetrics and Gynecology

## 2018-11-08 ENCOUNTER — Other Ambulatory Visit: Payer: Self-pay

## 2018-11-08 ENCOUNTER — Ambulatory Visit
Admission: RE | Admit: 2018-11-08 | Discharge: 2018-11-08 | Disposition: A | Discharge status: Home / Self Care | Payer: PRIVATE HEALTH INSURANCE | Source: Ambulatory Visit | Attending: Obstetrics and Gynecology | Admitting: Obstetrics and Gynecology

## 2018-11-08 DIAGNOSIS — Z1231 Encounter for screening mammogram for malignant neoplasm of breast: Secondary | ICD-10-CM

## 2019-10-12 ENCOUNTER — Other Ambulatory Visit: Payer: Self-pay | Admitting: Obstetrics and Gynecology

## 2019-10-12 DIAGNOSIS — Z1231 Encounter for screening mammogram for malignant neoplasm of breast: Secondary | ICD-10-CM

## 2019-12-07 ENCOUNTER — Ambulatory Visit
Admission: RE | Admit: 2019-12-07 | Discharge: 2019-12-07 | Disposition: A | Discharge status: Home / Self Care | Payer: PRIVATE HEALTH INSURANCE | Source: Ambulatory Visit | Attending: Obstetrics and Gynecology | Admitting: Obstetrics and Gynecology

## 2019-12-07 DIAGNOSIS — Z1231 Encounter for screening mammogram for malignant neoplasm of breast: Secondary | ICD-10-CM

## 2020-11-07 ENCOUNTER — Other Ambulatory Visit: Payer: Self-pay | Admitting: Obstetrics and Gynecology

## 2020-11-07 DIAGNOSIS — Z1231 Encounter for screening mammogram for malignant neoplasm of breast: Secondary | ICD-10-CM

## 2020-12-27 ENCOUNTER — Ambulatory Visit
Admission: RE | Admit: 2020-12-27 | Discharge: 2020-12-27 | Disposition: A | Discharge status: Home / Self Care | Payer: Self-pay | Source: Ambulatory Visit | Attending: Obstetrics and Gynecology | Admitting: Obstetrics and Gynecology

## 2020-12-27 ENCOUNTER — Other Ambulatory Visit: Payer: Self-pay

## 2020-12-27 DIAGNOSIS — Z1231 Encounter for screening mammogram for malignant neoplasm of breast: Secondary | ICD-10-CM

## 2021-09-23 ENCOUNTER — Other Ambulatory Visit: Payer: Self-pay | Admitting: Obstetrics and Gynecology

## 2021-09-23 DIAGNOSIS — Z1231 Encounter for screening mammogram for malignant neoplasm of breast: Secondary | ICD-10-CM

## 2021-12-30 ENCOUNTER — Ambulatory Visit
Admission: RE | Admit: 2021-12-30 | Discharge: 2021-12-30 | Disposition: A | Discharge status: Home / Self Care | Payer: 59 | Source: Ambulatory Visit | Attending: Obstetrics and Gynecology | Admitting: Obstetrics and Gynecology

## 2021-12-30 DIAGNOSIS — Z1231 Encounter for screening mammogram for malignant neoplasm of breast: Secondary | ICD-10-CM

## 2021-12-31 ENCOUNTER — Other Ambulatory Visit: Payer: Self-pay | Admitting: Obstetrics and Gynecology

## 2021-12-31 DIAGNOSIS — R928 Other abnormal and inconclusive findings on diagnostic imaging of breast: Secondary | ICD-10-CM

## 2022-01-06 ENCOUNTER — Other Ambulatory Visit: Payer: Self-pay | Admitting: Obstetrics and Gynecology

## 2022-01-06 ENCOUNTER — Ambulatory Visit
Admission: RE | Admit: 2022-01-06 | Discharge: 2022-01-06 | Disposition: A | Discharge status: Home / Self Care | Payer: 59 | Source: Ambulatory Visit | Attending: Obstetrics and Gynecology | Admitting: Obstetrics and Gynecology

## 2022-01-06 DIAGNOSIS — N6310 Unspecified lump in the right breast, unspecified quadrant: Secondary | ICD-10-CM

## 2022-01-06 DIAGNOSIS — R928 Other abnormal and inconclusive findings on diagnostic imaging of breast: Secondary | ICD-10-CM

## 2022-01-10 ENCOUNTER — Ambulatory Visit
Admission: RE | Admit: 2022-01-10 | Discharge: 2022-01-10 | Disposition: A | Discharge status: Home / Self Care | Payer: 59 | Source: Ambulatory Visit | Attending: Obstetrics and Gynecology | Admitting: Obstetrics and Gynecology

## 2022-01-10 DIAGNOSIS — N6310 Unspecified lump in the right breast, unspecified quadrant: Secondary | ICD-10-CM

## 2022-01-10 HISTORY — PX: BREAST BIOPSY: SHX20

## 2022-03-09 LAB — EXTERNAL GENERIC LAB PROCEDURE: COLOGUARD: NEGATIVE

## 2022-03-09 LAB — COLOGUARD: COLOGUARD: NEGATIVE

## 2022-10-22 ENCOUNTER — Other Ambulatory Visit: Payer: Self-pay | Admitting: Obstetrics and Gynecology

## 2022-10-22 DIAGNOSIS — Z1231 Encounter for screening mammogram for malignant neoplasm of breast: Secondary | ICD-10-CM

## 2023-01-12 ENCOUNTER — Ambulatory Visit: Payer: 59

## 2023-02-04 ENCOUNTER — Ambulatory Visit
Admission: RE | Admit: 2023-02-04 | Discharge: 2023-02-04 | Disposition: A | Discharge status: Home / Self Care | Payer: 59 | Source: Ambulatory Visit | Attending: Obstetrics and Gynecology | Admitting: Obstetrics and Gynecology

## 2023-02-04 DIAGNOSIS — Z1231 Encounter for screening mammogram for malignant neoplasm of breast: Secondary | ICD-10-CM

## 2023-08-07 IMAGING — MG MM DIGITAL SCREENING BILAT W/ TOMO AND CAD
8 series · 8 of 24 positions shown · non-contrast
Comparison: Previous exam(s).

CLINICAL DATA: Screening.

EXAM:
DIGITAL SCREENING BILATERAL MAMMOGRAM WITH TOMOSYNTHESIS AND CAD
TECHNIQUE: Bilateral screening digital craniocaudal and mediolateral oblique
mammograms were obtained. Bilateral screening digital breast
tomosynthesis was performed. The images were evaluated with
computer-aided detection.

[L MLO synth-2D]
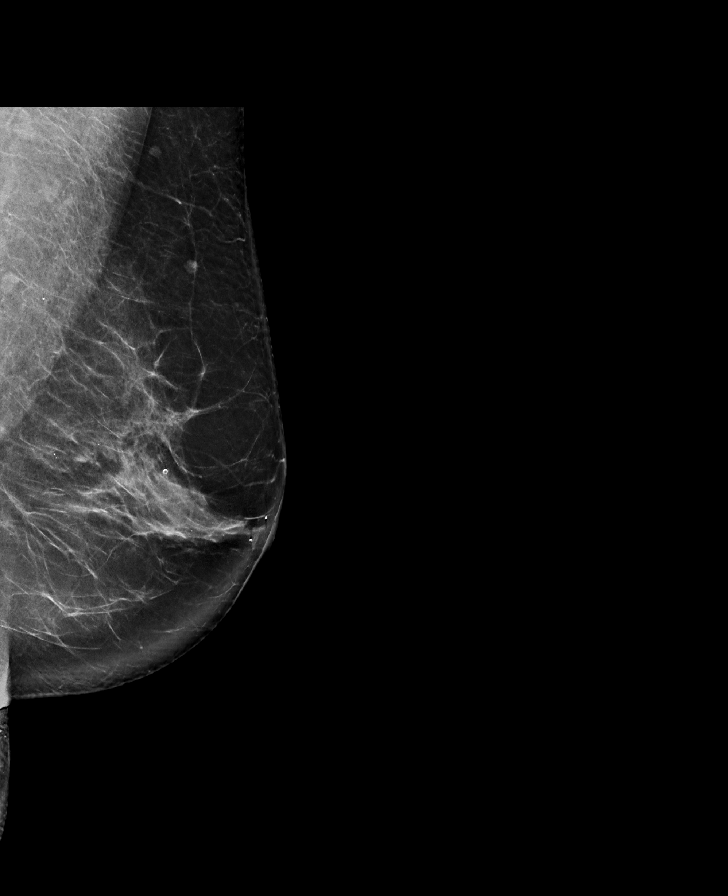

[L CC synth-2D]
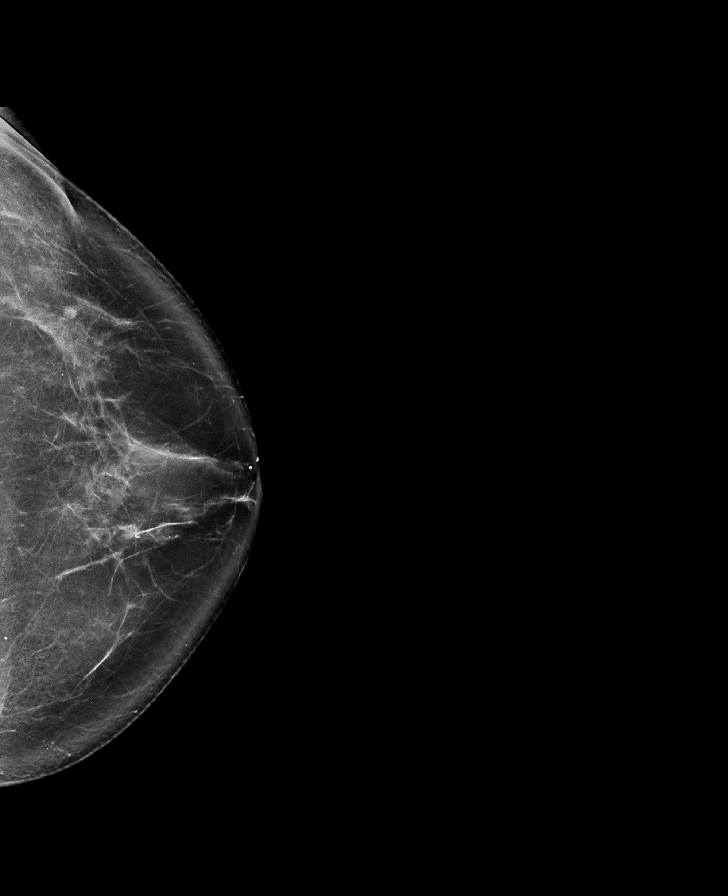

[R MLO synth-2D]
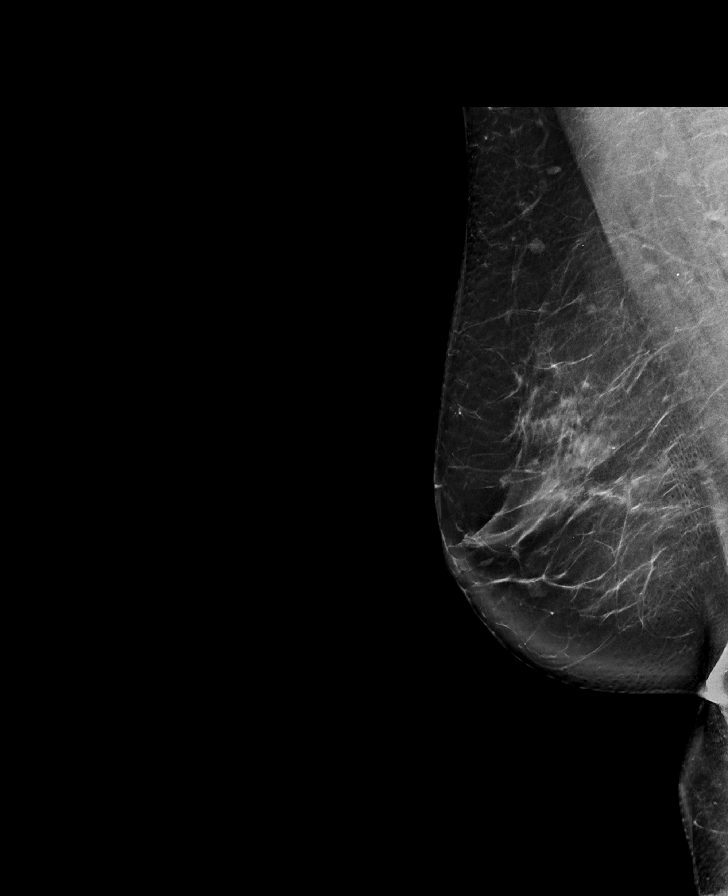

[R CC synth-2D]
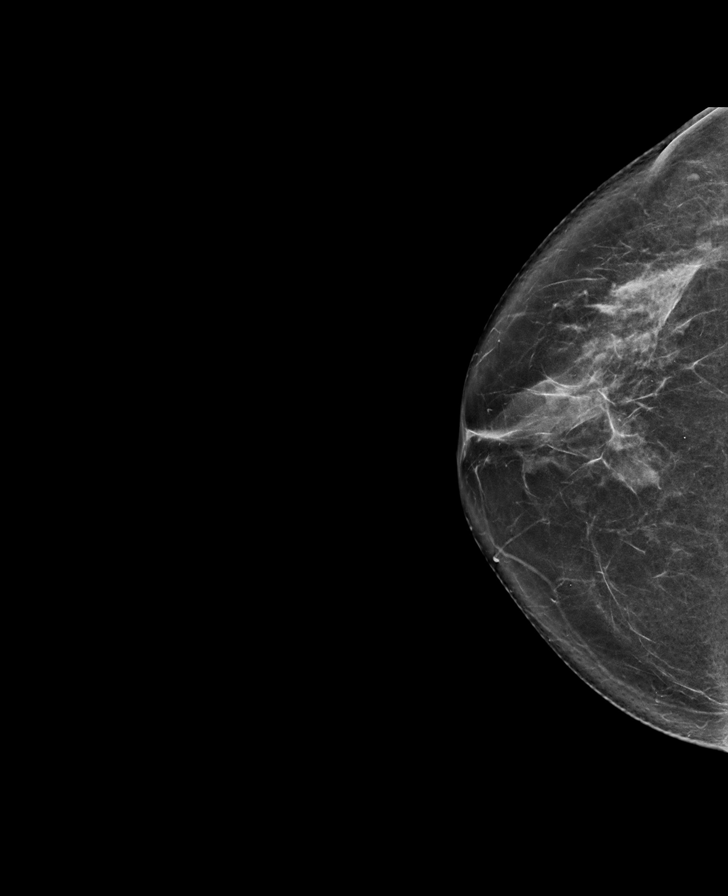

[L MLO tomo · tomo slice 45/90.0]
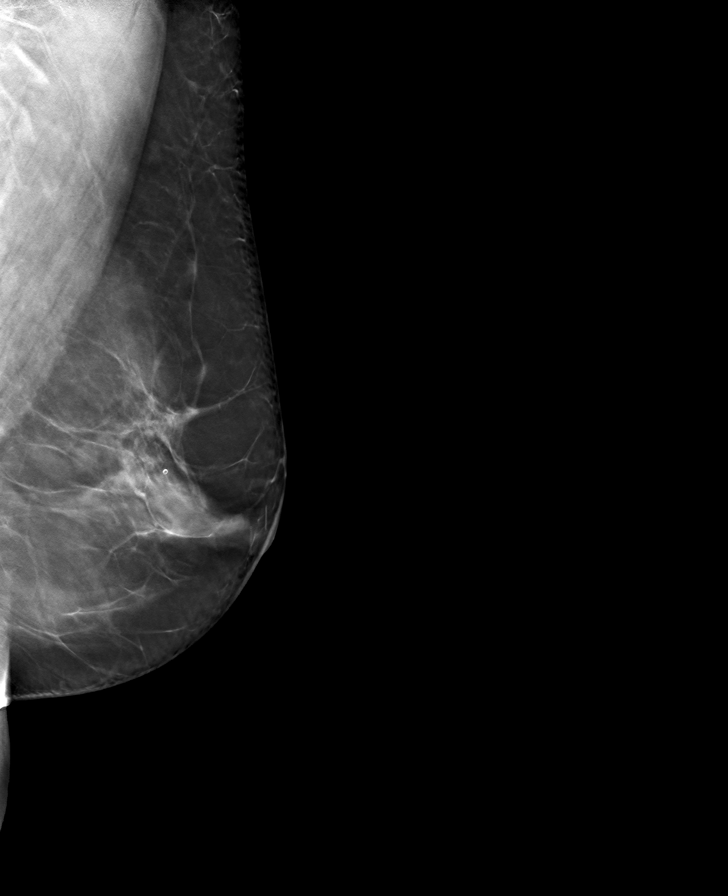

[R CC tomo · tomo slice 38/75.0]
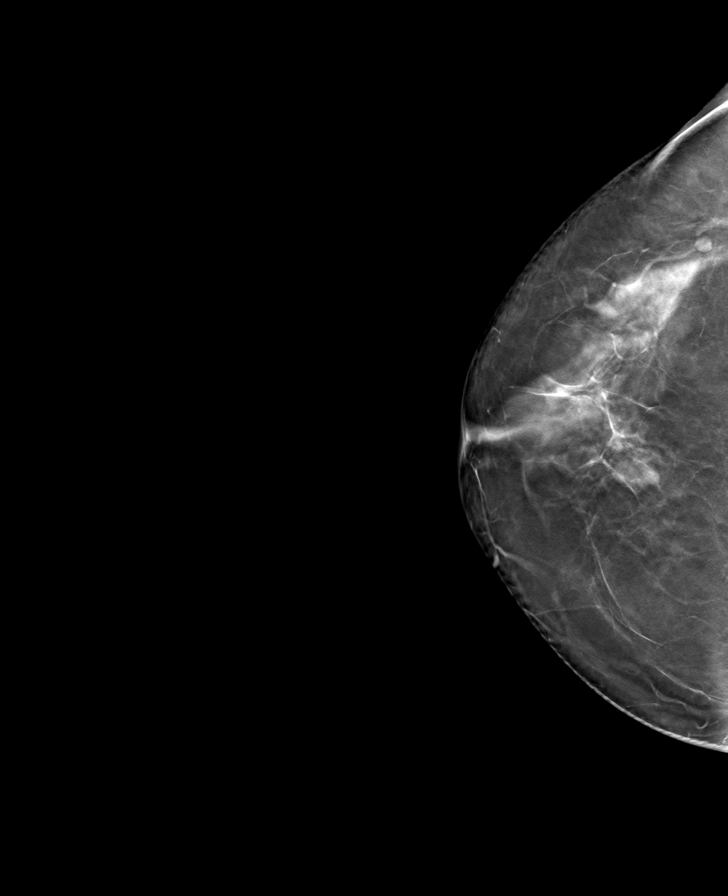

[R MLO tomo · tomo slice 47/92.0]
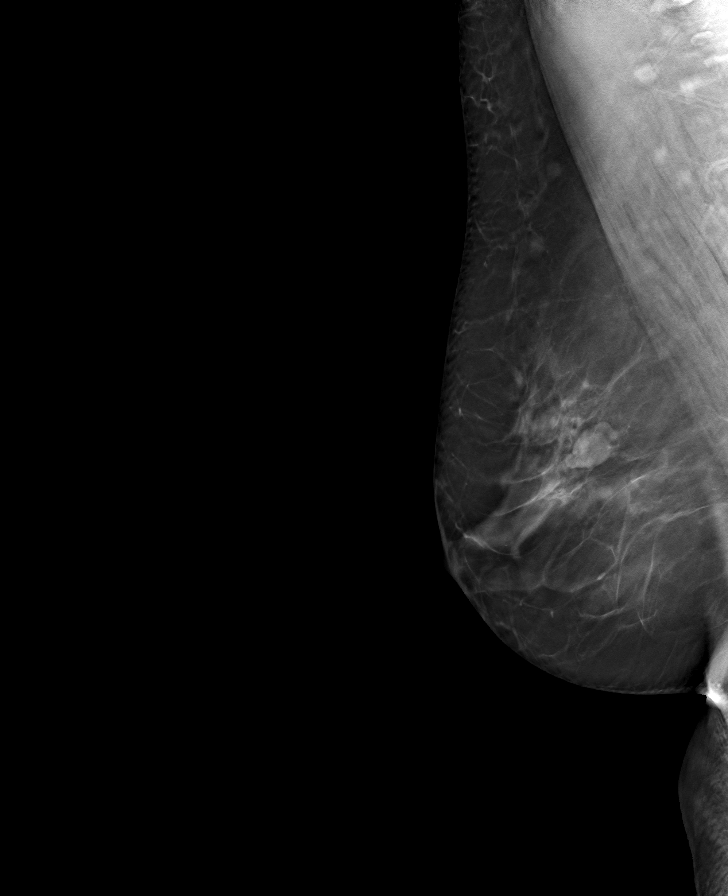

[L CC tomo · tomo slice 45/88.0]
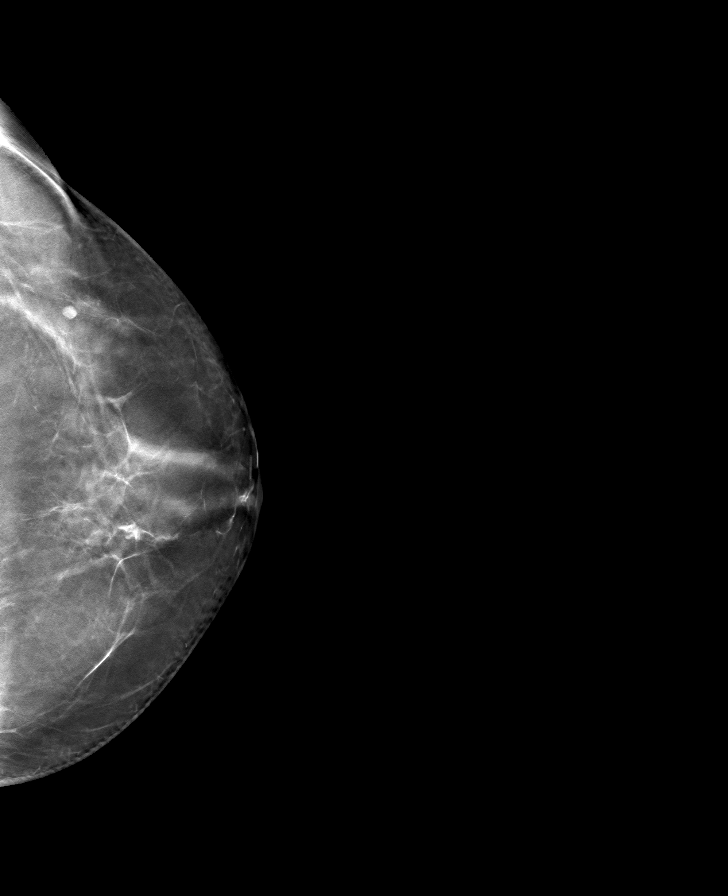

[8 of 24 positions shown; findings below may reference images not displayed]

ACR Breast Density Category b: There are scattered areas of
fibroglandular density.
FINDINGS: There are no findings suspicious for malignancy.
IMPRESSION: No mammographic evidence of malignancy. A result letter of this
screening mammogram will be mailed directly to the patient.

RECOMMENDATION:
Screening mammogram in one year. (Code:51-O-LD2)

BI-RADS CATEGORY  1: Negative.

## 2024-03-18 ENCOUNTER — Other Ambulatory Visit: Payer: Self-pay | Admitting: Obstetrics and Gynecology

## 2024-03-18 DIAGNOSIS — Z1231 Encounter for screening mammogram for malignant neoplasm of breast: Secondary | ICD-10-CM

## 2024-03-29 ENCOUNTER — Ambulatory Visit
Admission: RE | Admit: 2024-03-29 | Discharge: 2024-03-29 | Disposition: A | Discharge status: Home / Self Care | Source: Ambulatory Visit | Attending: Obstetrics and Gynecology | Admitting: Obstetrics and Gynecology

## 2024-03-29 DIAGNOSIS — Z1231 Encounter for screening mammogram for malignant neoplasm of breast: Secondary | ICD-10-CM

## 2024-05-05 ENCOUNTER — Encounter: Payer: Self-pay | Admitting: Gastroenterology

## 2024-05-05 ENCOUNTER — Other Ambulatory Visit: Payer: Self-pay

## 2024-05-05 ENCOUNTER — Ambulatory Visit: Admitting: Anesthesiology

## 2024-05-05 ENCOUNTER — Encounter: Admission: RE | Disposition: A | Payer: Self-pay | Source: Home / Self Care | Attending: Gastroenterology

## 2024-05-05 ENCOUNTER — Ambulatory Visit
Admission: RE | Admit: 2024-05-05 | Discharge: 2024-05-05 | Disposition: A | Discharge status: Home / Self Care | Attending: Gastroenterology | Admitting: Gastroenterology

## 2024-05-05 DIAGNOSIS — Z1211 Encounter for screening for malignant neoplasm of colon: Secondary | ICD-10-CM | POA: Diagnosis present

## 2024-05-05 DIAGNOSIS — D124 Benign neoplasm of descending colon: Secondary | ICD-10-CM | POA: Insufficient documentation

## 2024-05-05 DIAGNOSIS — D125 Benign neoplasm of sigmoid colon: Secondary | ICD-10-CM | POA: Diagnosis not present

## 2024-05-05 DIAGNOSIS — K644 Residual hemorrhoidal skin tags: Secondary | ICD-10-CM | POA: Insufficient documentation

## 2024-05-05 MED ORDER — PROPOFOL 10 MG/ML IV BOLUS
INTRAVENOUS | Status: DC | PRN
  Administered 2024-05-05: 30 mg via INTRAVENOUS
  Administered 2024-05-05: 60 mg via INTRAVENOUS

## 2024-05-05 MED ORDER — SODIUM CHLORIDE 0.9 % IV SOLN: INTRAVENOUS | Status: DC

## 2024-05-05 MED ORDER — PROPOFOL 500 MG/50ML IV EMUL
INTRAVENOUS | Status: DC | PRN
  Administered 2024-05-05: 140 ug/kg/min via INTRAVENOUS

## 2024-05-05 MED ORDER — LIDOCAINE HCL (CARDIAC) PF 100 MG/5ML IV SOSY
PREFILLED_SYRINGE | INTRAVENOUS | Status: DC | PRN
  Administered 2024-05-05: 30 mg via INTRAVENOUS

## 2024-05-05 NOTE — Anesthesia Postprocedure Evaluation (Signed)
"   Anesthesia Post Note  Patient: Madison Campbell  Procedure(s) Performed: COLONOSCOPY POLYPECTOMY, INTESTINE  Patient location during evaluation: PACU Anesthesia Type: General Level of consciousness: awake Pain management: pain level controlled Vital Signs Assessment: post-procedure vital signs reviewed and stable Respiratory status: spontaneous breathing Cardiovascular status: stable Anesthetic complications: no   No notable events documented.   Last Vitals:  Vitals:   05/05/24 1135 05/05/24 1137  BP: (!) 87/47 (!) 93/50  Pulse: 60 (!) 54  Resp: 16 18  Temp:    SpO2: 100% 100%    Last Pain:  Vitals:   05/05/24 1118  TempSrc:   PainSc: 0-No pain                 VAN STAVEREN,Kerianna Rawlinson      "

## 2024-05-05 NOTE — Transfer of Care (Signed)
 Immediate Anesthesia Transfer of Care Note  Patient: Madison Campbell  Procedure(s) Performed: COLONOSCOPY POLYPECTOMY, INTESTINE  Patient Location: PACU  Anesthesia Type:General  Level of Consciousness: drowsy  Airway & Oxygen Therapy: Patient Spontanous Breathing  Post-op Assessment: Report given to RN and Post -op Vital signs reviewed and stable  Post vital signs: Reviewed and stable  Last Vitals:  Vitals Value Taken Time  BP 93/46   Temp    Pulse 57 05/05/24 11:09  Resp 16 05/05/24 11:09  SpO2 100 % 05/05/24 11:09  Vitals shown include unfiled device data.  Last Pain:  Vitals:   05/05/24 1003  TempSrc: Tympanic  PainSc: 0-No pain         Complications: No notable events documented.

## 2024-05-05 NOTE — Anesthesia Preprocedure Evaluation (Signed)
"                                    Anesthesia Evaluation  Patient identified by MRN, date of birth, ID band Patient awake    Reviewed: Allergy & Precautions, NPO status , Patient's Chart, lab work & pertinent test results  Airway Mallampati: II  TM Distance: >3 FB Neck ROM: Full    Dental  (+) Teeth Intact   Pulmonary neg pulmonary ROS   Pulmonary exam normal        Cardiovascular Exercise Tolerance: Good negative cardio ROS Normal cardiovascular exam Rhythm:Regular Rate:Normal     Neuro/Psych negative neurological ROS  negative psych ROS   GI/Hepatic negative GI ROS, Neg liver ROS,,,  Endo/Other  negative endocrine ROS    Renal/GU negative Renal ROS  negative genitourinary   Musculoskeletal negative musculoskeletal ROS (+)    Abdominal Normal abdominal exam  (+)   Peds negative pediatric ROS (+)  Hematology negative hematology ROS (+)   Anesthesia Other Findings Past Medical History: No date: Breast cyst No date: Breast fibroadenoma No date: Dyspareunia, female No date: Menopausal state No date: Scoliosis No date: Torn ACL No date: Uterine fibroid No date: Vaginal dryness  Past Surgical History: No date: ACHILLES TENDON SURGERY No date: ACL REPAIR 01/10/2022: BREAST BIOPSY; Right No date: ENDOVENOUS ABLATION SAPHENOUS VEIN W/ LASER No date: EXCISION FIBROADENOMA  BMI    Body Mass Index: 22.44 kg/m      Reproductive/Obstetrics negative OB ROS                              Anesthesia Physical Anesthesia Plan  ASA: 1  Anesthesia Plan: General   Post-op Pain Management:    Induction: Intravenous  PONV Risk Score and Plan: Propofol  infusion and TIVA  Airway Management Planned: Natural Airway and Nasal Cannula  Additional Equipment:   Intra-op Plan:   Post-operative Plan:   Informed Consent: I have reviewed the patients History and Physical, chart, labs and discussed the procedure including the  risks, benefits and alternatives for the proposed anesthesia with the patient or authorized representative who has indicated his/her understanding and acceptance.     Dental Advisory Given  Plan Discussed with: CRNA  Anesthesia Plan Comments:         Anesthesia Quick Evaluation  "

## 2024-05-05 NOTE — Op Note (Addendum)
 [facility] Gastroenterology Patient Name: Aricela Bertagnolli Procedure Date: 05/05/2024 10:46 AM MRN: [id number] Account #: [id number] Date of Birth: 01/27/1961 Admit Type: Outpatient Age: 64 Room: [facility] ENDO ROOM 3 Gender: Female Note Status: Finalized Instrument Name: Colon Scope [id number] Procedure:             Colonoscopy Indications:           Screening for colorectal malignant neoplasm, This is                         the patient's first colonoscopy Providers:             Corinn Jess Brooklyn MD, MD Referring MD:          Corinn Jess Brooklyn MD, MD (Referring MD), No Local Md,                         MD (Referring MD) Medicines:             General Anesthesia Complications:         No immediate complications. Estimated blood loss:                         Minimal. Procedure:             Pre-Anesthesia Assessment:                        - Prior to the procedure, a History and Physical was                         performed, and patient medications and allergies were                         reviewed. The patient is competent. The risks and                         benefits of the procedure and the sedation options and                         risks were discussed with the patient. All questions                         were answered and informed consent was obtained.                         Patient identification and proposed procedure were                         verified by the physician, the nurse, the                         anesthesiologist, the anesthetist and the technician                         in the pre-procedure area in the procedure room in the                         endoscopy suite. Mental Status Examination: alert and  oriented. Airway Examination: normal oropharyngeal                         airway and neck mobility. Respiratory Examination:                         clear to auscultation. CV Examination: normal.                          Prophylactic Antibiotics: The patient does not require                         prophylactic antibiotics. Prior Anticoagulants: The                         patient has taken no anticoagulant or antiplatelet                         agents. ASA Grade Assessment: I - A normal, healthy                         patient. After reviewing the risks and benefits, the                         patient was deemed in satisfactory condition to                         undergo the procedure. The anesthesia plan was to use                         general anesthesia. Immediately prior to                         administration of medications, the patient was                         re-assessed for adequacy to receive sedatives. The                         heart rate, respiratory rate, oxygen saturations,                         blood pressure, adequacy of pulmonary ventilation, and                         response to care were monitored throughout the                         procedure. The physical status of the patient was                         re-assessed after the procedure.                        After obtaining informed consent, the colonoscope was                         passed under direct vision. Throughout the procedure,  the patient's blood pressure, pulse, and oxygen                         saturations were monitored continuously. The                         Colonoscope was introduced through the anus and                         advanced to the the terminal ileum, with                         identification of the appendiceal orifice and IC                         valve. The colonoscopy was performed without                         difficulty. The patient tolerated the procedure well.                         The quality of the bowel preparation was evaluated                         using the BBPS [facility] Bowel Preparation Scale) with                         scores of: Right  Colon = 3, Transverse Colon = 3 and                         Left Colon = 3 (entire mucosa seen well with no                         residual staining, small fragments of stool or opaque                         liquid). The total BBPS score equals 9. The ileocecal                         valve, appendiceal orifice, and rectum were                         photographed. Findings:      The perianal and digital rectal examinations were normal. Pertinent       negatives include normal sphincter tone and no palpable rectal lesions.      Two sessile polyps were found in the sigmoid colon and descending colon.       The polyps were 4 to 5 mm in size. These polyps were removed with a cold       snare. Resection and retrieval were complete. Estimated blood loss was       minimal.      Non-bleeding external hemorrhoids were found during retroflexion. The       hemorrhoids were medium-sized.      The terminal ileum appeared normal. Impression:            - Two 4 to 5 mm polyps in the sigmoid colon and in the  descending colon, removed with a cold snare. Resected                         and retrieved.                        - Non-bleeding external hemorrhoids.                        - The examined portion of the ileum was normal. Recommendation:        - Discharge patient to home (with escort).                        - Resume previous diet today.                        - Continue present medications.                        - Await pathology results.                        - Repeat colonoscopy in 5 to 7 years for surveillance                         based on pathology results. Procedure Code(s):     --- Professional ---                        [phone], Colonoscopy, flexible; with removal of                         tumor(s), polyp(s), or other lesion(s) by snare                         technique Diagnosis Code(s):     --- Professional ---                        Z12.11, Encounter  for screening for malignant neoplasm                         of colon                        D12.5, Benign neoplasm of sigmoid colon                        D12.4, Benign neoplasm of descending colon                        K64.4, Residual hemorrhoidal skin tags CPT copyright 2022 American Medical Association. All rights reserved. The codes documented in this report are preliminary and upon coder review may  be revised to meet current compliance requirements. Dr. Corinn Brooklyn Corinn Jess Brooklyn MD, MD 05/05/2024 11:09:25 AM This report has been signed electronically. Number of Addenda: 0 Note Initiated On: 05/05/2024 10:46 AM Scope Withdrawal Time: 0 hours 8 minutes 18 seconds  Total Procedure Duration: 0 hours 11 minutes 32 seconds  Estimated Blood Loss:  Estimated blood loss was minimal.      [facility]

## 2024-05-05 NOTE — H&P (Signed)
 "   Corinn JONELLE Brooklyn, MD [facility] Gastroenterology, [address]  [place], [place] [address]  Main: [phone] Fax:  [phone] Pager: [phone]   Primary Care Physician:  Cleotilde Oneil FALCON, MD Primary Gastroenterologist:  Dr. Corinn JONELLE Brooklyn  Pre-Procedure History & Physical: HPI:  Madison Campbell is a 64 y.o. female is here for an colonoscopy.   Past Medical History:  Diagnosis Date   Breast cyst    Breast fibroadenoma    Dyspareunia, female    Menopausal state    Scoliosis    Torn ACL    Uterine fibroid    Vaginal dryness     Past Surgical History:  Procedure Laterality Date   ACHILLES TENDON SURGERY     ACL REPAIR     BREAST BIOPSY Right 01/10/2022   ENDOVENOUS ABLATION SAPHENOUS VEIN W/ LASER     EXCISION FIBROADENOMA      Prior to Admission medications  Medication Sig Start Date End Date Taking? Authorizing Provider  calcium carbonate (CALCIUM 600) 600 MG TABS tablet Take by mouth.    [provider]    Allergies as of 04/29/2024 - Review Complete 10/01/2017  Allergen Reaction Noted   Penicillin g Other (See Comments) 09/19/2015    Family History  Problem Relation Age of Onset   Breast cancer Paternal Aunt    Colon cancer Neg Hx    Ovarian cancer Neg Hx    Heart disease Neg Hx     Social History   Socioeconomic History   Marital status: Married    Spouse name: Not on file   Number of children: Not on file   Years of education: Not on file   Highest education level: Not on file  Occupational History   Not on file  Tobacco Use   Smoking status: Never   Smokeless tobacco: Never  Vaping Use   Vaping status: Never Used  Substance and Sexual Activity   Alcohol use: No   Drug use: No   Sexual activity: Not Currently  Other Topics Concern   Not on file  Social History Narrative   Not on file   Social Drivers of Health   Tobacco Use: Low Risk  (04/28/2024)   [facility]   Patient  History    Smoking Tobacco Use: Never    Smokeless Tobacco Use: Never    Passive Exposure: Never  Financial Resource Strain: Not on file  Food Insecurity: Not on file  Transportation Needs: Not on file  Physical Activity: Not on file  Stress: Not on file  Social Connections: Not on file  Intimate Partner Violence: Not on file  Depression (EYV7-0): Not on file  Alcohol Screen: Not on file  Housing: Unknown (04/21/2024)   Received from [facility]   Epic    Unable to Pay for Housing in the Last Year: Not on file    Number of Times Moved in the Last Year: Not on file    At any time in the past 12 months, were you homeless or living in a shelter (including now)?: No  Utilities: Not on file  Health Literacy: Not on file    Review of Systems: See HPI, otherwise negative ROS  Physical Exam: BP (!) 112/49   Pulse 64   Temp (!) 96.9 F (36.1 C) (Tympanic)   Resp 20   Ht 5' 6 (1.676 m)   Wt 63 kg   SpO2 100%   BMI  22.44 kg/m  General:   Alert,  pleasant and cooperative in NAD Head:  Normocephalic and atraumatic. Neck:  Supple; no masses or thyromegaly. Lungs:  Clear throughout to auscultation.    Heart:  Regular rate and rhythm. Abdomen:  Soft, nontender and nondistended. Normal bowel sounds, without guarding, and without rebound.   Neurologic:  Alert and  oriented x4;  grossly normal neurologically.  Impression/Plan: Madison Campbell is here for an colonoscopy to be performed for colon cancer screening  Risks, benefits, limitations, and alternatives regarding  colonoscopy have been reviewed with the patient.  Questions have been answered.  All parties agreeable.   Corinn Brooklyn, MD  05/05/2024, 10:43 AM "

## 2024-05-09 LAB — SURGICAL PATHOLOGY

## 2024-05-10 ENCOUNTER — Ambulatory Visit: Payer: Self-pay | Admitting: Gastroenterology
